# Patient Record
Sex: Male | Born: 1995 | Race: Black or African American | Hispanic: No | Marital: Single
Health system: Southern US, Community
[De-identification: ages and names within clinical notes are randomized; demographics above are authoritative.]

## PROBLEM LIST (undated history)

## (undated) DIAGNOSIS — E669 Obesity, unspecified: Secondary | ICD-10-CM

## (undated) DIAGNOSIS — K8020 Calculus of gallbladder without cholecystitis without obstruction: Secondary | ICD-10-CM

## (undated) DIAGNOSIS — T7840XA Allergy, unspecified, initial encounter: Secondary | ICD-10-CM

## (undated) DIAGNOSIS — H539 Unspecified visual disturbance: Secondary | ICD-10-CM

## (undated) HISTORY — PX: FOOT SURGERY: SHX648

---
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
## 2017-05-11 ENCOUNTER — Encounter (HOSPITAL_COMMUNITY): Payer: Self-pay

## 2017-05-11 ENCOUNTER — Other Ambulatory Visit: Payer: Self-pay

## 2017-05-11 ENCOUNTER — Emergency Department (HOSPITAL_COMMUNITY)
Admission: EM | Admit: 2017-05-11 | Discharge: 2017-05-11 | Disposition: A | Discharge status: Home / Self Care | Payer: Self-pay | Attending: Emergency Medicine | Admitting: Emergency Medicine

## 2017-05-11 DIAGNOSIS — Z7722 Contact with and (suspected) exposure to environmental tobacco smoke (acute) (chronic): Secondary | ICD-10-CM | POA: Insufficient documentation

## 2017-05-11 DIAGNOSIS — Z202 Contact with and (suspected) exposure to infections with a predominantly sexual mode of transmission: Secondary | ICD-10-CM

## 2017-05-11 DIAGNOSIS — R3915 Urgency of urination: Secondary | ICD-10-CM

## 2017-05-11 DIAGNOSIS — R350 Frequency of micturition: Secondary | ICD-10-CM

## 2017-05-11 LAB — URINALYSIS, ROUTINE W REFLEX MICROSCOPIC
BILIRUBIN URINE: NEGATIVE
GLUCOSE, UA: NEGATIVE mg/dL
Hgb urine dipstick: NEGATIVE
Ketones, ur: NEGATIVE mg/dL
Leukocytes, UA: NEGATIVE
NITRITE: NEGATIVE
PH: 5 (ref 5.0–8.0)
Protein, ur: NEGATIVE mg/dL
SPECIFIC GRAVITY, URINE: 1.02 (ref 1.005–1.030)

## 2017-05-11 MED ORDER — LIDOCAINE HCL (PF) 1 % IJ SOLN
INTRAMUSCULAR | Status: AC
  Administered 2017-05-11: 5 mL
  Filled 2017-05-11: qty 5

## 2017-05-11 MED ORDER — AZITHROMYCIN 250 MG PO TABS
1000.0000 mg | ORAL_TABLET | Freq: Once | ORAL | Status: AC
  Administered 2017-05-11: 1000 mg via ORAL
  Filled 2017-05-11: qty 4

## 2017-05-11 MED ORDER — CEFTRIAXONE SODIUM 250 MG IJ SOLR
250.0000 mg | Freq: Once | INTRAMUSCULAR | Status: AC
  Administered 2017-05-11: 250 mg via INTRAMUSCULAR
  Filled 2017-05-11: qty 250

## 2017-05-11 NOTE — ED Provider Notes (Signed)
[facility] Provider Note   CSN: [id number] Arrival date & time: 05/11/17  1143     History   Chief Complaint Chief Complaint  Patient presents with  . Urinary Frequency    HPI Ronald Perry is a 22 y.o. male who presents with a 3-day history of urinary frequency and urgency.  He reports his symptoms started after he had oral sex with someone.  He denies having intercourse.  He denies any other recent intercourse.  He denies any penile discharge, testicular pain, swelling, or lesions.  He denies any fevers, abdominal pain, nausea, vomiting.  HPI  Past Medical History:  Diagnosis Date  . Allergy   . Cholelithiasis    symptomatic  . Obesity   . Vision abnormalities    wears glasses    Patient Active Problem List   Diagnosis Date Noted  . Acute cholecystitis with chronic cholecystitis 01/24/2015    Past Surgical History:  Procedure Laterality Date  . CHOLECYSTECTOMY N/A 01/24/2015   Procedure: LAPAROSCOPIC CHOLECYSTECTOMY ;  Surgeon: Jimmye NormanJames Wyatt, MD;  [facility];  Service: General;  Laterality: N/A;  . ERCP N/A 01/26/2015   Procedure: ENDOSCOPIC RETROGRADE CHOLANGIOPANCREATOGRAPHY (ERCP);  Surgeon: Jeani HawkingPatrick Hung, MD;  [facility];  Service: Endoscopy;  Laterality: N/A;  . FOOT SURGERY     toenails removed on great toes       Home Medications    Prior to Admission medications   Not on File    Family History Family History  Problem Relation Age of Onset  . Hypertension Maternal Grandmother   . Diabetes Maternal Grandmother     Social History Social History   Tobacco Use  . Smoking status: Passive Smoke Exposure - Never Smoker  . Smokeless tobacco: Never Used  Substance Use Topics  . Alcohol use: No  . Drug use: Yes    Types: Marijuana     Allergies   Patient has no known allergies.   Review of Systems Review of Systems  Constitutional: Negative for fever.  Gastrointestinal: Negative for  abdominal pain, nausea and vomiting.  Genitourinary: Positive for frequency and urgency. Negative for discharge, penile pain, penile swelling, scrotal swelling and testicular pain.     Physical Exam Updated Vital Signs BP (!) 155/79 (BP Location: Right Arm)   Pulse 90   Temp 99.5 F (37.5 C) (Oral)   Resp 16   Ht 5\' 9"  (1.753 m)   Wt 111.1 kg (245 lb)   SpO2 100%   BMI 36.18 kg/m   Physical Exam  Constitutional: He appears well-developed and well-nourished. No distress.  HENT:  Head: Normocephalic and atraumatic.  Mouth/Throat: Oropharynx is clear and moist. No oropharyngeal exudate.  Eyes: Conjunctivae are normal. Pupils are equal, round, and reactive to light. Right eye exhibits no discharge. Left eye exhibits no discharge. No scleral icterus.  Neck: Normal range of motion.  Cardiovascular: Normal rate, regular rhythm, normal heart sounds and intact distal pulses. Exam reveals no gallop and no friction rub.  No murmur heard. Pulmonary/Chest: Effort normal and breath sounds normal. No stridor. No respiratory distress. He has no wheezes. He has no rales.  Abdominal: Soft. Bowel sounds are normal. He exhibits no distension. There is no tenderness. There is no rebound and no guarding.  Musculoskeletal: He exhibits no edema.  Neurological: He is alert. Coordination normal.  Skin: Skin is warm and dry. No rash noted. He is not diaphoretic. No pallor.  Psychiatric: He has a normal mood and  affect.  Nursing note and vitals reviewed.    ED Treatments / Results  Labs (all labs ordered are listed, but only abnormal results are displayed) Labs Reviewed  URINALYSIS, ROUTINE W REFLEX MICROSCOPIC  GC/CHLAMYDIA PROBE AMP ([place]) NOT AT [facility]    EKG  EKG Interpretation None       Radiology No results found.  Procedures Procedures (including critical care time)  Medications Ordered in ED Medications  azithromycin (ZITHROMAX) tablet 1,000 mg (not administered)    cefTRIAXone (ROCEPHIN) injection 250 mg (not administered)     Initial Impression / Assessment and Plan / ED Course  I have reviewed the triage vital signs and the nursing notes.  Pertinent labs & imaging results that were available during my care of the patient were reviewed by me and considered in my medical decision making (see chart for details).     Patient with urinary frequency and urgency.  He denies penile discharge.  Patient treated in the ED for STI with Rocephin, azithromycin. Patient advised to inform and treat all sexual partners.  Pt advised on safe sex practices and understands that they have GC/Chlamydia cultures pending and will result in 2-3 days. HIV and RPR declined.  UA is negative.  Pt encouraged to follow up at local health department for future STI checks. No concern for prostatitis or epididymitis. Discussed return precautions.  Patient given follow-up to urology and advised to call if symptoms are not resolving after 7-10 days after treatment.  Patient understands and agrees with plan.  Patient vitals stable throughout ED course and discharged in satisfactory condition.  Final Clinical Impressions(s) / ED Diagnoses   Final diagnoses:  Urinary frequency  Urinary urgency  Possible exposure to STD    ED Discharge Orders    None       Emi Holes, PA-C 05/11/17 1316    Jacalyn Lefevre, MD 05/11/17 1407

## 2017-05-11 NOTE — ED Triage Notes (Signed)
Per Pt, Pt is coming from home with complaints of cloudy urine and urinary frequency that started four days ago.

## 2017-05-11 NOTE — Discharge Instructions (Signed)
You have been treated for gonorrhea and chlamydia today. You will be called in 3 days this days if any of your tests return positive. In that case, please make all of your sexual partners aware that they will need to be treated as well. Abstain from intercourse for one week until you have both been treated. Use condoms in the future to help prevent sexually transmitted disease and unwanted pregnancy. You can go to the health department in the future for free STD testing.  If your symptoms are not improving over the next 7-10 days, please call the urologist's office to make an appointment for further evaluation and treatment of your urinary symptoms.  Please return to the emergency department if you develop any new or worsening symptoms.

## 2017-05-11 NOTE — ED Notes (Signed)
Declined W/C at D/C and was escorted to lobby by RN. 

## 2017-05-12 LAB — GC/CHLAMYDIA PROBE AMP (~~LOC~~) NOT AT ARMC
Chlamydia: NEGATIVE
Neisseria Gonorrhea: NEGATIVE

## 2017-08-10 ENCOUNTER — Other Ambulatory Visit: Payer: Self-pay

## 2017-08-10 ENCOUNTER — Emergency Department (HOSPITAL_COMMUNITY)
Admission: EM | Admit: 2017-08-10 | Discharge: 2017-08-10 | Disposition: A | Discharge status: Home / Self Care | Payer: Self-pay | Attending: Physician Assistant | Admitting: Physician Assistant

## 2017-08-10 ENCOUNTER — Encounter (HOSPITAL_COMMUNITY): Payer: Self-pay | Admitting: Emergency Medicine

## 2017-08-10 DIAGNOSIS — R3915 Urgency of urination: Secondary | ICD-10-CM | POA: Insufficient documentation

## 2017-08-10 DIAGNOSIS — Z7722 Contact with and (suspected) exposure to environmental tobacco smoke (acute) (chronic): Secondary | ICD-10-CM | POA: Insufficient documentation

## 2017-08-10 DIAGNOSIS — R350 Frequency of micturition: Secondary | ICD-10-CM

## 2017-08-10 LAB — URINALYSIS, ROUTINE W REFLEX MICROSCOPIC
BILIRUBIN URINE: NEGATIVE
Glucose, UA: NEGATIVE mg/dL
HGB URINE DIPSTICK: NEGATIVE
Ketones, ur: 20 mg/dL — AB
LEUKOCYTES UA: NEGATIVE
NITRITE: NEGATIVE
PROTEIN: 30 mg/dL — AB
RBC / HPF: NONE SEEN RBC/hpf (ref 0–5)
Specific Gravity, Urine: 1.032 — ABNORMAL HIGH (ref 1.005–1.030)
pH: 5 (ref 5.0–8.0)

## 2017-08-10 LAB — CBG MONITORING, ED: Glucose-Capillary: 92 mg/dL (ref 65–99)

## 2017-08-10 NOTE — ED Provider Notes (Signed)
[facility]   CSN: [id number] Arrival date & time: 08/10/17  1012     History   Chief Complaint Chief Complaint  Patient presents with  . Urinary Frequency    HPI Ronald Perry is a 22 y.o. male presenting to the ED with complaint of increased urinary frequency for 1 week.  He denies any associated dysuria, hematuria, penile pain or discharge, testicular pain or swelling, pain with defecation, abdominal pain.  States he has not been sexually active since previous ED visit in January.  He reports some lesions on his penis and one on the roof of his mouth that are not painful, or itchy.  He states he can only see the lesions on his penis is erect. Per chart review, patient had visit in January 2019, with negative GC/chlamydia, and negative UA.  He was treated with Rocephin and azithromycin prophylactically.  Declined HIV and RPR at that time.  The history is provided by the patient.    Past Medical History:  Diagnosis Date  . Allergy   . Cholelithiasis    symptomatic  . Obesity   . Vision abnormalities    wears glasses    Patient Active Problem List   Diagnosis Date Noted  . Acute cholecystitis with chronic cholecystitis 01/24/2015    Past Surgical History:  Procedure Laterality Date  . CHOLECYSTECTOMY N/A 01/24/2015   Procedure: LAPAROSCOPIC CHOLECYSTECTOMY ;  Surgeon: Jimmye NormanJames Wyatt, MD;  [facility];  Service: General;  Laterality: N/A;  . ERCP N/A 01/26/2015   Procedure: ENDOSCOPIC RETROGRADE CHOLANGIOPANCREATOGRAPHY (ERCP);  Surgeon: Jeani HawkingPatrick Hung, MD;  [facility];  Service: Endoscopy;  Laterality: N/A;  . FOOT SURGERY     toenails removed on great toes        Home Medications    Prior to Admission medications   Not on File    Family History Family History  Problem Relation Age of Onset  . Hypertension Maternal Grandmother   . Diabetes Maternal Grandmother     Social History Social  History   Tobacco Use  . Smoking status: Passive Smoke Exposure - Never Smoker  . Smokeless tobacco: Never Used  Substance Use Topics  . Alcohol use: No  . Drug use: Yes    Types: Marijuana     Allergies   Patient has no known allergies.   Review of Systems Review of Systems  Genitourinary: Positive for frequency. Negative for discharge, dysuria, hematuria, penile pain, scrotal swelling and testicular pain.  Skin: Positive for rash.     Physical Exam Updated Vital Signs BP (!) 141/55 (BP Location: Right Arm)   Pulse 78   Temp 98.1 F (36.7 C) (Oral)   Resp 16   SpO2 100%   Physical Exam  Constitutional: He appears well-developed and well-nourished. No distress.  Morbidly obese  HENT:  Head: Normocephalic and atraumatic.  No oral lesions visualized on exam.  Eyes: Conjunctivae are normal.  Cardiovascular: Normal rate.  Pulmonary/Chest: Effort normal.  Abdominal: Soft. Bowel sounds are normal. He exhibits no distension. There is no tenderness.  Genitourinary: Testes normal. Right testis shows no mass, no swelling and no tenderness. Left testis shows no mass, no swelling and no tenderness. Uncircumcised. No penile tenderness. No discharge found.  Genitourinary Comments: Exam performed with male RN chaperone present. Normal genital exam. No lesions or rash visualized  Psychiatric: He has a normal mood and affect. His behavior is normal.  Nursing note and vitals reviewed.  ED Treatments / Results  Labs (all labs ordered are listed, but only abnormal results are displayed) Labs Reviewed  URINALYSIS, ROUTINE W REFLEX MICROSCOPIC - Abnormal; Notable for the following components:      Result Value   Specific Gravity, Urine 1.032 (*)    Ketones, ur 20 (*)    Protein, ur 30 (*)    Bacteria, UA FEW (*)    Squamous Epithelial / LPF 0-5 (*)    All other components within normal limits  HIV ANTIBODY (ROUTINE TESTING)  RPR  CBG MONITORING, ED  GC/CHLAMYDIA PROBE AMP  ([place]) NOT AT [facility]    EKG None  Radiology No results found.  Procedures Procedures (including critical care time)  Medications Ordered in ED Medications - No data to display   Initial Impression / Assessment and Plan / ED Course  I have reviewed the triage vital signs and the nursing notes.  Pertinent labs & imaging results that were available during my care of the patient were reviewed by me and considered in my medical decision making (see chart for details).     Patient presenting to the ED with complaint of increased urinary frequency times 1 week.  No other urinary symptoms reported.  Patient also reporting some genital and oral lesions, however on exam no such lesions were visualized.  Normal GU and oral exam.  No testicular swelling or pain, no swelling or erythema of the scrotum.  Urine negative for infection.  CBG normal.  GC/Chlamydia, HIV, and RPR sent.  Patient was treated last ED visit prophylactically with Rocephin and azithromycin, and denies any sexual interaction since that visit.  Will hold off on treating prophylactically, and wait for test results.  Patient agrees with this plan.  Encourage p.o. hydration, and follow-up with PCP.  Safe for discharge.  Discussed results, findings, treatment and follow up. Patient advised of return precautions. Patient verbalized understanding and agreed with plan.  Final Clinical Impressions(s) / ED Diagnoses   Final diagnoses:  Urinary frequency    ED Discharge Orders    None       Sonny Anthes, [place] N, PA-C 08/10/17 1232    Abelino Derrick, MD 08/10/17 1525

## 2017-08-10 NOTE — ED Triage Notes (Signed)
Pt states he has been having frequent urination for the last week. Pt also reports something abnormal on his penis he describes this as , "tiny indentions on the end of his penis".

## 2017-08-10 NOTE — Discharge Instructions (Addendum)
Your urine was not showing signs of infection today. Drink plenty of water. You will receive a call from the hospital if your test results come back positive. Avoid sexual activity until you know your test results. If your results come back positive, it is important that you inform all of your sexual partners.  Return to the ER for new or worsening symptoms.

## 2017-08-11 LAB — GC/CHLAMYDIA PROBE AMP (~~LOC~~) NOT AT ARMC
Chlamydia: NEGATIVE
NEISSERIA GONORRHEA: NEGATIVE

## 2017-08-11 LAB — RPR: RPR Ser Ql: NONREACTIVE

## 2017-08-11 LAB — HIV ANTIBODY (ROUTINE TESTING W REFLEX): HIV Screen 4th Generation wRfx: NONREACTIVE
